# Patient Record
Sex: Female | Born: 1960 | Race: Asian | Hispanic: No | Marital: Married | Smoking: Never smoker
Health system: Southern US, Community
[De-identification: ages and names within clinical notes are randomized; demographics above are authoritative.]

## PROBLEM LIST (undated history)

## (undated) HISTORY — PX: LUNG SURGERY: SHX703

## (undated) HISTORY — PX: APPENDECTOMY: SHX54

---

## 2020-05-26 ENCOUNTER — Encounter (HOSPITAL_BASED_OUTPATIENT_CLINIC_OR_DEPARTMENT_OTHER): Payer: Self-pay

## 2020-05-26 ENCOUNTER — Emergency Department (HOSPITAL_BASED_OUTPATIENT_CLINIC_OR_DEPARTMENT_OTHER)

## 2020-05-26 ENCOUNTER — Other Ambulatory Visit: Payer: Self-pay

## 2020-05-26 ENCOUNTER — Emergency Department (HOSPITAL_BASED_OUTPATIENT_CLINIC_OR_DEPARTMENT_OTHER)
Admission: EM | Admit: 2020-05-26 | Discharge: 2020-05-26 | Disposition: A | Discharge status: Home / Self Care | Attending: Emergency Medicine | Admitting: Emergency Medicine

## 2020-05-26 DIAGNOSIS — M25532 Pain in left wrist: Secondary | ICD-10-CM | POA: Diagnosis present

## 2020-05-26 DIAGNOSIS — Y929 Unspecified place or not applicable: Secondary | ICD-10-CM | POA: Insufficient documentation

## 2020-05-26 DIAGNOSIS — Y999 Unspecified external cause status: Secondary | ICD-10-CM | POA: Insufficient documentation

## 2020-05-26 DIAGNOSIS — M25552 Pain in left hip: Secondary | ICD-10-CM | POA: Insufficient documentation

## 2020-05-26 DIAGNOSIS — Y939 Activity, unspecified: Secondary | ICD-10-CM | POA: Insufficient documentation

## 2020-05-26 DIAGNOSIS — W010XXA Fall on same level from slipping, tripping and stumbling without subsequent striking against object, initial encounter: Secondary | ICD-10-CM | POA: Insufficient documentation

## 2020-05-26 DIAGNOSIS — S52501A Unspecified fracture of the lower end of right radius, initial encounter for closed fracture: Secondary | ICD-10-CM

## 2020-05-26 DIAGNOSIS — S52502A Unspecified fracture of the lower end of left radius, initial encounter for closed fracture: Secondary | ICD-10-CM | POA: Diagnosis not present

## 2020-05-26 MED ORDER — HYDROCODONE-ACETAMINOPHEN 5-325 MG PO TABS
1.0000 | ORAL_TABLET | Freq: Once | ORAL | Status: AC
  Administered 2020-05-26: 1 via ORAL
  Filled 2020-05-26: qty 1

## 2020-05-26 MED ORDER — HYDROCODONE-ACETAMINOPHEN 5-325 MG PO TABS: 1.0000 | ORAL_TABLET | Freq: Four times a day (QID) | ORAL | 0 refills | Status: AC | PRN

## 2020-05-26 NOTE — ED Notes (Signed)
ED Provider at bedside. 

## 2020-05-26 NOTE — ED Notes (Signed)
Patient transported to X-ray 

## 2020-05-26 NOTE — ED Notes (Addendum)
Ice pack given for lt wrist

## 2020-05-26 NOTE — ED Triage Notes (Signed)
Reports a mechanical fall and landed on her L wrist and L hip. Pt primarily complaining of L wrist injury.

## 2020-05-26 NOTE — Discharge Instructions (Signed)
Follow-up with the hand surgeon back home.

## 2020-05-26 NOTE — ED Provider Notes (Signed)
[facility] EMERGENCY DEPARTMENT Provider Note   CSN: [id number] Arrival date & time: 05/26/20  1920     History Chief Complaint  Patient presents with  . Fall    Casey Miranda is a 59 y.o. female.  HPI Patient presents after a fall.  States sheTripped and fell landing on her left wrist and left hip.  Complaining of pain in the left wrist but states her left hip feels okay now.  No loss conscious.  No neck pain.  States the hand is become more swollen along the wrist.  States it does shoot down the forearm.  No tenderness in the elbow.    History reviewed. No pertinent past medical history.  There are no problems to display for this patient.   Past Surgical History:  Procedure Laterality Date  . APPENDECTOMY    . CESAREAN SECTION    . LUNG SURGERY       OB History   No obstetric history on file.     No family history on file.  Social History   Tobacco Use  . Smoking status: Never Smoker  . Smokeless tobacco: Never Used  Vaping Use  . Vaping Use: Never used  Substance Use Topics  . Alcohol use: Yes  . Drug use: Never    Home Medications Prior to Admission medications   Medication Sig Start Date End Date Taking? Authorizing Provider  Calcium Carb-Cholecalciferol (CALCIUM 1000 + D) 1000-800 MG-UNIT TABS Take by mouth.   Yes [provider]  cetirizine (ZYRTEC) 10 MG tablet Take by mouth. 05/29/09  Yes [provider]  famotidine (PEPCID) 20 MG tablet Take by mouth. 11/07/18  Yes [provider]  fluticasone (FLONASE) 50 MCG/ACT nasal spray Place into the nose. 07/23/10 01/20/21 Yes [provider]  Multiple Vitamin (MULTI-VITAMIN) tablet Take 1 tablet by mouth daily.   Yes [provider]  omeprazole (PRILOSEC) 20 MG capsule Take by mouth. 07/23/10  Yes [provider]  pseudoephedrine (SUDAFED) 30 MG tablet Take by mouth.   Yes [provider]  cyanocobalamin (,VITAMIN B-12,) 1000 MCG/ML  injection 1,000 mcg every 30 (thirty) days .    [provider]  HYDROcodone-acetaminophen (NORCO/VICODIN) 5-325 MG tablet Take 1-2 tablets by mouth every 6 (six) hours as needed. 05/26/20   Benjiman Core, MD    Allergies    Patient has no known allergies.  Review of Systems   Review of Systems  Constitutional: Negative for appetite change.  HENT: Negative for congestion.   Respiratory: Negative for shortness of breath.   Cardiovascular: Negative for chest pain.  Gastrointestinal: Negative for abdominal pain.  Genitourinary: Negative for flank pain.  Musculoskeletal: Negative for back pain.       Left wrist pain  Skin: Negative for rash.  Neurological: Negative for weakness and numbness.  Psychiatric/Behavioral: Negative for confusion.    Physical Exam Updated Vital Signs BP (!) 163/99 (BP Location: Right Arm)   Pulse 92   Temp 99 F (37.2 C) (Oral)   Resp 14   Ht 5\' 4"  (1.626 m)   Wt 70.3 kg   SpO2 96%   BMI 26.61 kg/m   Physical Exam Vitals reviewed.  HENT:     Head: Normocephalic.  Cardiovascular:     Rate and Rhythm: Regular rhythm.  Chest:     Chest wall: No tenderness.  Abdominal:     Tenderness: There is no abdominal tenderness.  Musculoskeletal:     Cervical back: Neck supple.  Comments: No tenderness over shoulder or elbow.  Does have tenderness over wrist particularly on the distal radial aspect.  Does have some bruising however posteriorly on the ulnar aspect distally.  Sensation grossly intact in hand.  Radial pulse intact.  Does have some snuffbox tenderness  Skin:    General: Skin is warm.     Capillary Refill: Capillary refill takes less than 2 seconds.  Neurological:     Mental Status: She is alert and oriented to person, place, and time.     ED Results / Procedures / Treatments   Labs (all labs ordered are listed, but only abnormal results are displayed) Labs Reviewed - No data to display  EKG None  Radiology DG Wrist  Complete Left  Result Date: 05/26/2020 CLINICAL DATA:  Fall on outstretched hand EXAM: LEFT WRIST - COMPLETE 3+ VIEW COMPARISON:  None. FINDINGS: No dislocation evident. Possible subtle nondisplaced fracture lucency at the radial styloid. Positive for soft tissue swelling. IMPRESSION: Possible subtle nondisplaced fracture at the radial styloid Electronically Signed   By: Donavan Foil M.D.   On: 05/26/2020 20:15    Procedures Procedures (including critical care time)  Medications Ordered in ED Medications  HYDROcodone-acetaminophen (NORCO/VICODIN) 5-325 MG per tablet 1 tablet (1 tablet Oral Given 05/26/20 2120)    ED Course  I have reviewed the triage vital signs and the nursing notes.  Pertinent labs & imaging results that were available during my care of the patient were reviewed by me and considered in my medical decision making (see chart for details).    MDM Rules/Calculators/A&P                          Patient with fall.  Distal radius fracture.  Discussed with Dr. Grandville Silos about follow-up.  He would be happy to see her, however patient is from out of town and is leaving back to [place] on Wednesday.  Patient given sugar tong splint and a copy of the imaging and will follow up back in [place] Final Clinical Impression(s) / ED Diagnoses Final diagnoses:  Closed fracture of distal end of right radius, unspecified fracture morphology, initial encounter    Rx / DC Orders ED Discharge Orders         Ordered    HYDROcodone-acetaminophen (NORCO/VICODIN) 5-325 MG tablet  Every 6 hours PRN     Discontinue  Reprint     05/26/20 2150           Davonna Belling, MD 05/27/20 0006

## 2020-12-19 IMAGING — CR DG WRIST COMPLETE 3+V*L*
4 series · 4 of 4 positions shown · non-contrast
Comparison: None.

CLINICAL DATA: Fall on outstretched hand

EXAM:
LEFT WRIST - COMPLETE 3+ VIEW

[x wrist pa left]
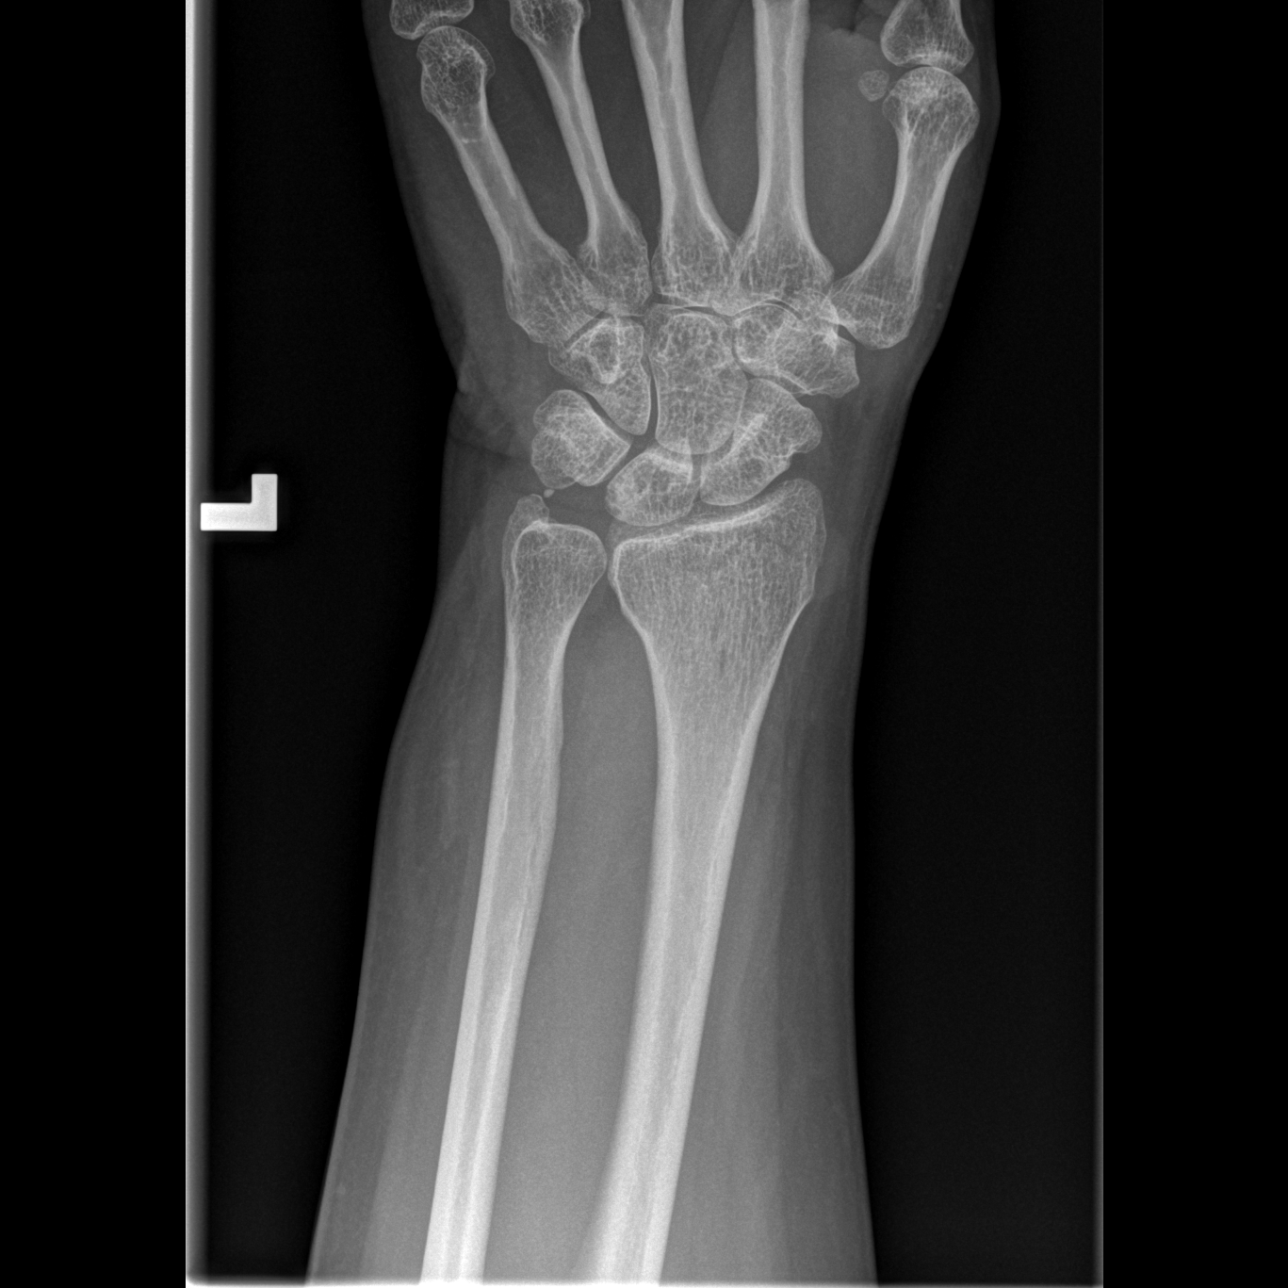

[x wrist obl left]
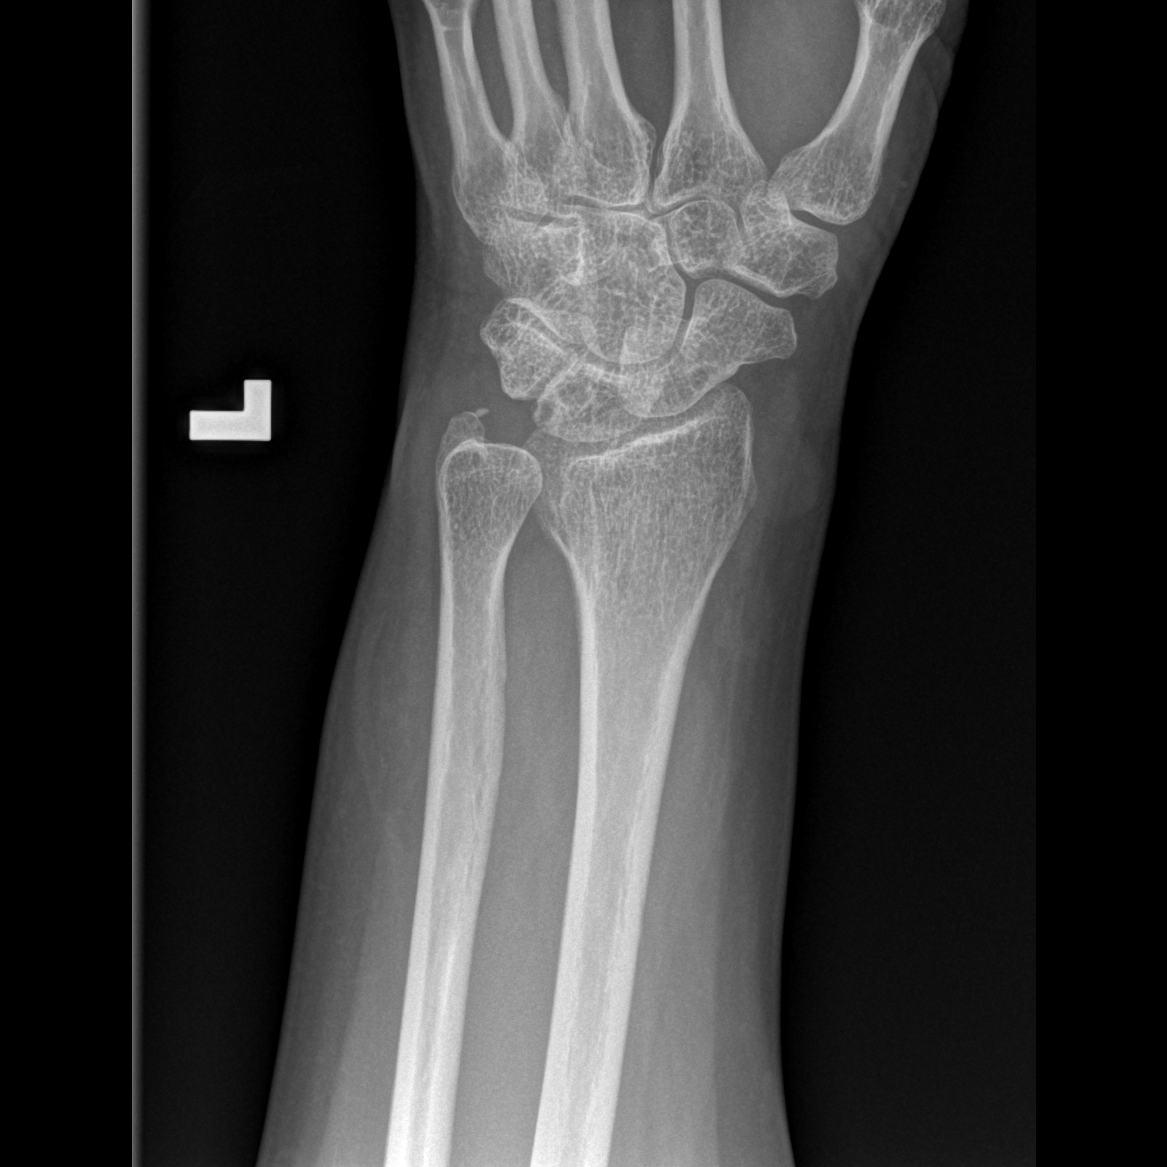

[x wrist lat left]
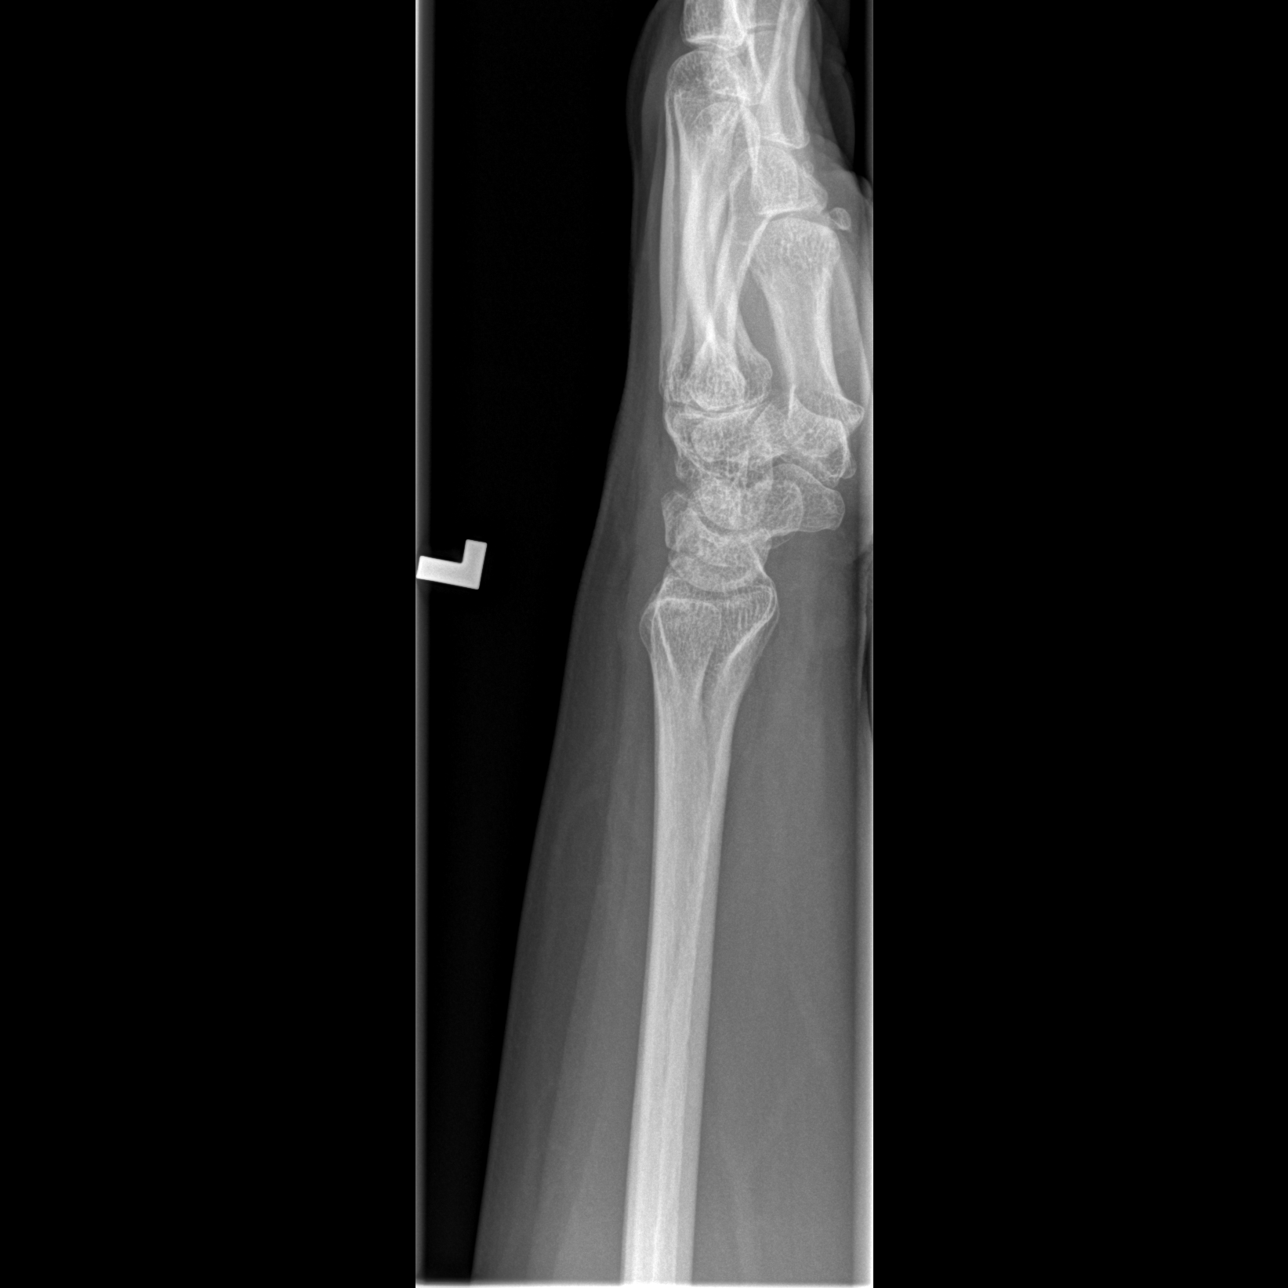

[x navicular]
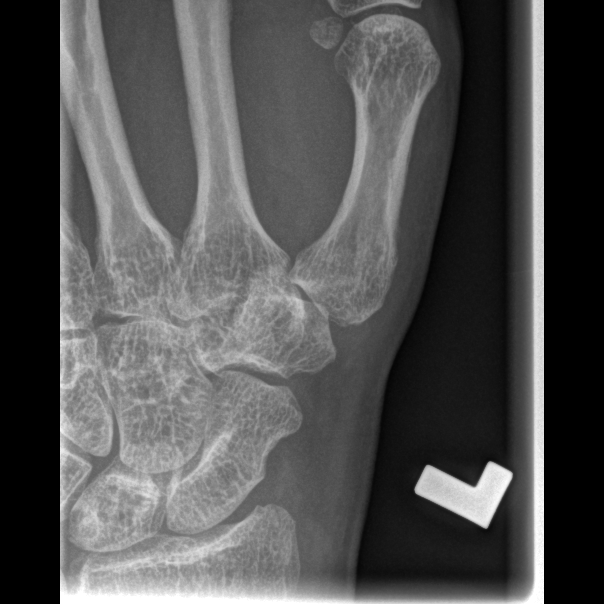

[4 of 4 positions shown; findings below may reference images not displayed]

FINDINGS: No dislocation evident. Possible subtle nondisplaced fracture
lucency at the radial styloid. Positive for soft tissue swelling.
IMPRESSION: Possible subtle nondisplaced fracture at the radial styloid
